# Patient Record
Sex: Male | Born: 1960 | Race: Black or African American | Hispanic: No | Marital: Married | Smoking: Never smoker
Health system: Southern US, Community
[De-identification: ages and names within clinical notes are randomized; demographics above are authoritative.]

## PROBLEM LIST (undated history)

## (undated) DIAGNOSIS — B009 Herpesviral infection, unspecified: Secondary | ICD-10-CM

## (undated) DIAGNOSIS — R51 Headache: Secondary | ICD-10-CM

## (undated) DIAGNOSIS — M47816 Spondylosis without myelopathy or radiculopathy, lumbar region: Secondary | ICD-10-CM

## (undated) DIAGNOSIS — I10 Essential (primary) hypertension: Secondary | ICD-10-CM

## (undated) HISTORY — DX: Essential (primary) hypertension: I10

## (undated) HISTORY — DX: Headache: R51

## (undated) HISTORY — DX: Herpesviral infection, unspecified: B00.9

## (undated) HISTORY — DX: Spondylosis without myelopathy or radiculopathy, lumbar region: M47.816

---

## 2007-01-25 ENCOUNTER — Emergency Department (HOSPITAL_COMMUNITY): Admission: EM | Admit: 2007-01-25 | Discharge: 2007-01-25 | Payer: Self-pay | Admitting: Emergency Medicine

## 2008-11-13 ENCOUNTER — Emergency Department (HOSPITAL_BASED_OUTPATIENT_CLINIC_OR_DEPARTMENT_OTHER): Admission: EM | Admit: 2008-11-13 | Discharge: 2008-11-13 | Payer: Self-pay | Admitting: Emergency Medicine

## 2011-05-01 ENCOUNTER — Encounter (INDEPENDENT_AMBULATORY_CARE_PROVIDER_SITE_OTHER): Payer: 59 | Admitting: Family Medicine

## 2011-05-01 DIAGNOSIS — R030 Elevated blood-pressure reading, without diagnosis of hypertension: Secondary | ICD-10-CM

## 2011-05-01 DIAGNOSIS — Z0000 Encounter for general adult medical examination without abnormal findings: Secondary | ICD-10-CM

## 2011-05-01 DIAGNOSIS — M25559 Pain in unspecified hip: Secondary | ICD-10-CM

## 2011-05-02 ENCOUNTER — Ambulatory Visit (INDEPENDENT_AMBULATORY_CARE_PROVIDER_SITE_OTHER): Payer: 59

## 2011-05-02 DIAGNOSIS — M545 Low back pain: Secondary | ICD-10-CM

## 2011-05-02 DIAGNOSIS — M25559 Pain in unspecified hip: Secondary | ICD-10-CM

## 2011-05-02 LAB — LIPID PANEL
Cholesterol: 176 mg/dL (ref 0–200)
LDL Cholesterol: 119 mg/dL
LDl/HDL Ratio: 3.7
Triglycerides: 43 mg/dL (ref 40–160)

## 2011-05-09 DIAGNOSIS — Z0271 Encounter for disability determination: Secondary | ICD-10-CM

## 2011-05-14 ENCOUNTER — Ambulatory Visit (INDEPENDENT_AMBULATORY_CARE_PROVIDER_SITE_OTHER): Payer: 59 | Admitting: Family Medicine

## 2011-05-14 ENCOUNTER — Ambulatory Visit: Payer: 59 | Admitting: Family Medicine

## 2011-05-14 DIAGNOSIS — M47817 Spondylosis without myelopathy or radiculopathy, lumbosacral region: Secondary | ICD-10-CM

## 2011-05-20 ENCOUNTER — Other Ambulatory Visit: Payer: Self-pay | Admitting: Family Medicine

## 2011-05-20 DIAGNOSIS — M545 Low back pain: Secondary | ICD-10-CM

## 2011-05-22 ENCOUNTER — Inpatient Hospital Stay: Admission: RE | Admit: 2011-05-22 | Payer: Self-pay | Source: Ambulatory Visit

## 2011-06-10 ENCOUNTER — Encounter: Payer: Self-pay | Admitting: *Deleted

## 2011-06-10 DIAGNOSIS — M47816 Spondylosis without myelopathy or radiculopathy, lumbar region: Secondary | ICD-10-CM | POA: Insufficient documentation

## 2011-06-12 ENCOUNTER — Ambulatory Visit: Payer: Self-pay | Admitting: Family Medicine

## 2011-07-07 ENCOUNTER — Ambulatory Visit: Payer: 59

## 2011-07-07 ENCOUNTER — Ambulatory Visit (INDEPENDENT_AMBULATORY_CARE_PROVIDER_SITE_OTHER): Payer: 59 | Admitting: Family Medicine

## 2011-07-07 ENCOUNTER — Encounter: Payer: Self-pay | Admitting: Physician Assistant

## 2011-07-07 VITALS — BP 152/98 | HR 96 | Temp 98.0°F | Resp 20 | Ht 70.0 in | Wt 201.0 lb

## 2011-07-07 DIAGNOSIS — K5900 Constipation, unspecified: Secondary | ICD-10-CM

## 2011-07-07 DIAGNOSIS — I10 Essential (primary) hypertension: Secondary | ICD-10-CM

## 2011-07-07 MED ORDER — HYDROCHLOROTHIAZIDE 12.5 MG PO CAPS: 12.5000 mg | ORAL_CAPSULE | ORAL | Status: DC

## 2011-07-07 NOTE — Progress Notes (Signed)
Patient ID: Calvin Baldwin MRN: [id number], DOB: May 01, 1960, 51 y.o. Date of Encounter: 07/07/2011, 4:03 PM  Primary Physician: No primary provider on file.  Chief Complaint: HTN, constipation, leg warmth  HPI: 51 y.o. year old male with history below presents for hypertension follow up. Blood pressure not at goal.  Not currently on medication. Poor diet. No CP, HA, visual changes, or focal deficits. Requests BP check today. He also mentions 3-4 month history of constipation and mild abdominal cramping. He will routinely go 3-4 days without a bowel movement. When he does does have a BM it is difficult to pass. No BRBPR or melena. Does not eat a high fiber diet. He states he gets a very bloated feeling. This embarrasses him. No GU complaints. Afebrile. Lastly, he mentions a warm sensation along his bilateral anterior shins for 3-4 months. There is no pain associated with this. He has full range of motion and full strength. No wounds or trauma. No numbness or tingling. His back pain has fully resolved. Back to work at full duty.   Past Medical History  Diagnosis Date  . HSV-1 (herpes simplex virus 1) infection   . Headache   . Lumbar spondylosis      Home Meds: Prior to Admission medications   Medication Sig Start Date End Date Taking? Authorizing Provider  valACYclovir (VALTREX) 1000 MG tablet Take 1,000 mg by mouth as needed.    Historical Provider, MD    Allergies: Allergies no known allergies  History   Social History  . Marital Status: Married    Spouse Name: N/A    Number of Children: 3  . Years of Education: N/A   Occupational History  . 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 Smoking status: Never Smoker   . Smokeless tobacco: Not on file  . Alcohol Use: Not on file  . Drug Use: Not on file  . Sexually Active: Not on file   Other Topics Concern  . Not on file   Social History Narrative   Pt native to [place]. Hiv negative  06/2004.     No family history on file.  Review of Systems: Constitutional: negative for chills, fever, night sweats, weight changes, or fatigue  HEENT: negative for vision changes, hearing loss, congestion, rhinorrhea, ST, epistaxis, or sinus pressure Cardiovascular: negative for chest pain, palpitations, or DOE Respiratory: negative for hemoptysis, wheezing, shortness of breath, or cough Abdominal: negative for abdominal pain, nausea, vomiting, diarrhea, BRBPR, or melena Dermatological: negative for rash Neurologic: negative for headache, dizziness, or syncope All other systems reviewed and are otherwise negative with the exception to those above and in the HPI.   Physical Exam: Blood pressure 152/98, pulse 96, temperature 98 F (36.7 C), temperature source Oral, resp. rate 20, height 5\' 10"  (1.778 m), weight 201 lb (91.173 kg)., Body mass index is 28.84 kg/(m^2). General: Well developed, well nourished, in no acute distress. Head: Normocephalic, atraumatic, eyes without discharge, sclera non-icteric, nares are without discharge. Bilateral auditory canals clear, TM's are without perforation, pearly grey and translucent with reflective cone of light bilaterally. Oral cavity moist, posterior pharynx without exudate, erythema, peritonsillar abscess, or post nasal drip.  Neck: Supple. No thyromegaly. Full ROM. No lymphadenopathy. No carotid bruits. Lungs: Clear bilaterally to auscultation without wheezes, rales, or rhonchi. Breathing is unlabored. Heart: RRR with S1 S2. No murmurs, rubs, or gallops appreciated.  Abdomen: Soft, non-tender, non-distended with normoactive bowel sounds. No hepatosplenomegaly. No rebound/guarding. No  obvious abdominal masses. Msk:  FROM 5/5 strength bilateral legs. Normal sensation. Normal muscle tone.  Extremities/Skin: Warm and dry. No clubbing or cyanosis. No edema. No rashes or suspicious lesions. Distal pulses 2+ and equal bilaterally. Neuro: Alert and  oriented X 3. Moves all extremities spontaneously. Gait is normal. CNII-XII grossly in tact. DTR 2+, cerebellar function intact. Rhomberg normal. Psych:  Responds to questions appropriately with a normal affect.   Labs:  CMP and TSH pending. Patient is non-fasting  [facility] reading (PRIMARY) by  Dr. Hal Hope. Constipation. O/w NAD.    ASSESSMENT AND PLAN:  51 y.o. year old male with hypertension, constipation, and warm shins. 1. Hypertension -Not at goal -Start HCTZ 12.5 mg 1 po daily #30 RF 1 -Recheck 4-5 weeks -Healthy diet and exercise  2. Constipation -Miralax -Push fluids -High fiber diet  3. Warm shins -Unclear etiology at this point in the setting of a normal exam, no rashes, wounds, swelling, or trauma -Await TSH -Trial of NSAIDs -RTC if persists  4. Discussed with Dr. Hal Hope  Signed, Eula Listen, PA-C 07/07/2011 4:03 PM

## 2011-07-08 LAB — COMPREHENSIVE METABOLIC PANEL
Alkaline Phosphatase: 98 U/L (ref 39–117)
CO2: 22 mEq/L (ref 19–32)
Creat: 0.67 mg/dL (ref 0.50–1.35)
Glucose, Bld: 137 mg/dL — ABNORMAL HIGH (ref 70–99)
Sodium: 138 mEq/L (ref 135–145)
Total Bilirubin: 0.3 mg/dL (ref 0.3–1.2)

## 2011-07-08 LAB — TSH: TSH: 0.924 u[IU]/mL (ref 0.350–4.500)

## 2011-08-18 ENCOUNTER — Ambulatory Visit (INDEPENDENT_AMBULATORY_CARE_PROVIDER_SITE_OTHER): Payer: 59 | Admitting: Physician Assistant

## 2011-08-18 ENCOUNTER — Encounter: Payer: Self-pay | Admitting: Physician Assistant

## 2011-08-18 VITALS — BP 129/77 | HR 78 | Temp 98.1°F | Resp 16 | Ht 70.0 in | Wt 200.6 lb

## 2011-08-18 DIAGNOSIS — R7309 Other abnormal glucose: Secondary | ICD-10-CM

## 2011-08-18 DIAGNOSIS — K5900 Constipation, unspecified: Secondary | ICD-10-CM

## 2011-08-18 DIAGNOSIS — R252 Cramp and spasm: Secondary | ICD-10-CM

## 2011-08-18 DIAGNOSIS — I10 Essential (primary) hypertension: Secondary | ICD-10-CM

## 2011-08-18 DIAGNOSIS — R739 Hyperglycemia, unspecified: Secondary | ICD-10-CM

## 2011-08-18 LAB — COMPREHENSIVE METABOLIC PANEL
ALT: 15 U/L (ref 0–53)
AST: 27 U/L (ref 0–37)
BUN: 11 mg/dL (ref 6–23)
Creat: 0.92 mg/dL (ref 0.50–1.35)
Total Bilirubin: 0.2 mg/dL — ABNORMAL LOW (ref 0.3–1.2)

## 2011-08-18 MED ORDER — LISINOPRIL 5 MG PO TABS: 5.0000 mg | ORAL_TABLET | Freq: Every day | ORAL | Status: DC

## 2011-08-18 NOTE — Progress Notes (Signed)
Patient ID: Calvin Baldwin MRN: [id number], DOB: [birth year], 51 y.o. Date of Encounter: 08/18/2011, 6:21 PM  Primary Physician: No primary provider on file.  Chief Complaint: HTN  HPI: 51 y.o. year old male with history below presents for hypertension follow up. Doing well. Tolerating medication daily without issues. BP is considerably improved today now that he is on medication daily. Healthy diet with minimal exercise. He does have an occasional headache as the season has been changing. No HA currently. No CP, vision changes, or focal deficits.   He does mention off and on muscle cramping along his lower back and right forearm for the past year. He has taken Motrin for this in the past with good results. No injury or trauma. No rashes.   His constipation has mostly resolved with taking Miralax daily and increasing his water intake. No further bloating or fullness along his abdomen.   Past Medical History  Diagnosis Date  . HSV-1 (herpes simplex virus 1) infection   . Headache   . Lumbar spondylosis   . Hypertension      Home Meds: Prior to Admission medications   Medication Sig Start Date End Date Taking? Authorizing Provider  ibuprofen (ADVIL,MOTRIN) 200 MG tablet Take 400 mg by mouth every 6 (six) hours as needed.   Yes Historical Provider, MD  Polyethylene Glycol 3350 (MIRALAX PO) Take by mouth.   Yes Historical Provider, MD  cyclobenzaprine (FLEXERIL) 10 MG tablet Take 10-15 mg by mouth at bedtime as needed.    Historical Provider, MD  diclofenac (VOLTAREN) 75 MG EC tablet Take 75 mg by mouth 2 (two) times daily.    Historical Provider, MD  lisinopril (PRINIVIL,ZESTRIL) 5 MG tablet Take 1 tablet (5 mg total) by mouth daily. 08/18/11 08/17/12  Raymon Mutton Jada Fass, PA-C  traMADol (ULTRAM) 50 MG tablet Take 50 mg by mouth every 12 (twelve) hours.    Historical Provider, MD  valACYclovir (VALTREX) 1000 MG tablet Take 1,000 mg by mouth as needed.    Historical Provider, MD    Allergies: No  Known Allergies  History   Social History  . Marital Status: Married    Spouse Name: N/A    Number of Children: 3  . Years of Education: N/A   Occupational History  . 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 Smoking status: Never Smoker   . Smokeless tobacco: Not on file  . Alcohol Use: No  . Drug Use: No  . Sexually Active: Not on file   Other Topics Concern  . Not on file   Social History Narrative   Pt native to [place]. Hiv negative 06/2004.     No family history on file.  Review of Systems: Constitutional: negative for chills, fever, night sweats, weight changes, or fatigue  HEENT: negative for vision changes, hearing loss, congestion, rhinorrhea, ST, epistaxis, or sinus pressure Cardiovascular: negative for chest pain, palpitations, or DOE Respiratory: negative for hemoptysis, wheezing, shortness of breath, or cough Abdominal: negative for abdominal pain, nausea, vomiting, diarrhea, or constipation Dermatological: negative for rash Neurologic: negative for headache, dizziness, or syncope All other systems reviewed and are otherwise negative with the exception to those above and in the HPI.   Physical Exam: Blood pressure 129/77, pulse 78, temperature 98.1 F (36.7 C), temperature source Oral, resp. rate 16, height 5\' 10"  (1.778 m), weight 200 lb 9.6 oz (90.992 kg)., Body mass index is 28.78 kg/(m^2). General: Well developed, well nourished, in no  acute distress. Head: Normocephalic, atraumatic, eyes without discharge, sclera non-icteric, nares are without discharge. Bilateral auditory canals clear, TM's are without perforation, pearly grey and translucent with reflective cone of light bilaterally. Oral cavity moist, posterior pharynx without exudate, erythema, peritonsillar abscess, or post nasal drip.  Neck: Supple. No thyromegaly. Full ROM. No lymphadenopathy. No carotid bruits. Lungs: Clear bilaterally to auscultation without  wheezes, rales, or rhonchi. Breathing is unlabored. Heart: RRR with S1 S2. No murmurs, rubs, or gallops appreciated.  Msk:  Strength and tone normal for age. Extremities/Skin: Warm and dry. No clubbing or cyanosis. No edema. No rashes or suspicious lesions. Distal pulses 2+ and equal bilaterally. Neuro: Alert and oriented X 3. Moves all extremities spontaneously. Gait is normal. CNII-XII grossly in tact. DTR 2+, cerebellar function intact. Rhomberg normal. Psych:  Responds to questions appropriately with a normal affect.   Labs:  CMP and A1C pending  ASSESSMENT AND PLAN:  51 y.o. year old male with HTN, muscle cramps, and headache 1. HTN -Stop HCTZ -Start Lisinopril 5 mg #30 1 po daily RF 2  2. Muscle cramps -Check electrolytes -Push water -Healthy diet -If persist with rest RTC to further discuss  3. Headache -Likely secondary to allergies at this time -If it persists plan to have him seen by the [facility]  Signed, Eula Listen, [place] 08/18/2011 6:21 PM

## 2011-11-27 ENCOUNTER — Telehealth: Payer: Self-pay

## 2011-11-27 NOTE — Telephone Encounter (Signed)
Pt received a message on his answering machine.  He has slight language barrier and could not understand message from lab.  [phone].

## 2011-11-28 NOTE — Telephone Encounter (Signed)
Spoke to patient he states he has appt next week and will review then.

## 2011-11-28 NOTE — Telephone Encounter (Signed)
Please get more information.

## 2011-12-01 ENCOUNTER — Ambulatory Visit: Payer: 59

## 2011-12-01 ENCOUNTER — Ambulatory Visit: Payer: 59 | Admitting: Physician Assistant

## 2011-12-01 ENCOUNTER — Ambulatory Visit (INDEPENDENT_AMBULATORY_CARE_PROVIDER_SITE_OTHER): Payer: 59 | Admitting: Family Medicine

## 2011-12-01 VITALS — BP 154/112 | HR 57 | Temp 98.0°F | Resp 16 | Ht 69.18 in | Wt 195.6 lb

## 2011-12-01 DIAGNOSIS — R143 Flatulence: Secondary | ICD-10-CM

## 2011-12-01 DIAGNOSIS — K5900 Constipation, unspecified: Secondary | ICD-10-CM

## 2011-12-01 DIAGNOSIS — R141 Gas pain: Secondary | ICD-10-CM

## 2011-12-01 DIAGNOSIS — R51 Headache: Secondary | ICD-10-CM

## 2011-12-01 DIAGNOSIS — R140 Abdominal distension (gaseous): Secondary | ICD-10-CM

## 2011-12-01 DIAGNOSIS — R109 Unspecified abdominal pain: Secondary | ICD-10-CM

## 2011-12-01 LAB — POCT URINALYSIS DIPSTICK
Bilirubin, UA: NEGATIVE
Blood, UA: NEGATIVE
Ketones, UA: NEGATIVE
Leukocytes, UA: NEGATIVE
Protein, UA: NEGATIVE
pH, UA: 7

## 2011-12-01 LAB — POCT CBC
HCT, POC: 49 % (ref 43.5–53.7)
Hemoglobin: 15.1 g/dL (ref 14.1–18.1)
Lymph, poc: 3.1 (ref 0.6–3.4)
MCH, POC: 29.7 pg (ref 27–31.2)
MCHC: 30.8 g/dL — AB (ref 31.8–35.4)
MPV: 8.4 fL (ref 0–99.8)
POC Granulocyte: 3 (ref 2–6.9)
POC LYMPH PERCENT: 46.5 %L (ref 10–50)
POC MID %: 8.3 %M (ref 0–12)
RDW, POC: 13.3 %
WBC: 6.7 10*3/uL (ref 4.6–10.2)

## 2011-12-01 LAB — POCT UA - MICROSCOPIC ONLY
Bacteria, U Microscopic: NEGATIVE
Casts, Ur, LPF, POC: NEGATIVE
Crystals, Ur, HPF, POC: NEGATIVE
Mucus, UA: NEGATIVE
Yeast, UA: NEGATIVE

## 2011-12-01 NOTE — Progress Notes (Signed)
Subjective: 50 year old (his birth date is listed wrong in this country) listed as 51 year old man from [place]. He works at [employer]. He has to carry 100 pound boxes. He sometimes works 12 hours a day. Is not working that well now. He hurts across his abdomen. He gets very bloated. He has been constipated, goes days without moving his bowels. He is taking MiraLax without relief. He sits on the toilet and passes a lot of gas. He feels so bloated that he cannot eat. His usual diet is a African diet of a lot of rice and bread and chicken and beef sometimes vegetables. He took one banana every day. No problems with his urine.  Objective: African male in no major distress. His chest is clear. Heart regular. Abdomen had normal bowel sounds. It is slightly full, soft without masses. Mild nonspecific tenderness. No CVA tenderness.  Assessment: Abdominal and back pain Bloating Headache Constipation  Plan: Abdominal x-rays, CBC, urine, Sinemet, TSH  Results for orders placed in visit on 12/01/11  POCT CBC      Component Value Range   WBC 6.7  4.6 - 10.2 K/uL   Lymph, poc 3.1  0.6 - 3.4   POC LYMPH PERCENT 46.5  10 - 50 %L   MID (cbc) 0.6  0 - 0.9   POC MID % 8.3  0 - 12 %M   POC Granulocyte 3.0  2 - 6.9   Granulocyte percent 45.2  37 - 80 %G   RBC 5.08  4.69 - 6.13 M/uL   Hemoglobin 15.1  14.1 - 18.1 g/dL   HCT, POC 13.0  86.5 - 53.7 %   MCV 96.4  80 - 97 fL   MCH, POC 29.7  27 - 31.2 pg   MCHC 30.8 (*) 31.8 - 35.4 g/dL   RDW, POC 78.4     Platelet Count, POC 276  142 - 424 K/uL   MPV 8.4  0 - 99.8 fL  POCT URINALYSIS DIPSTICK      Component Value Range   Color, UA yellow     Clarity, UA clear     Glucose, UA neg     Bilirubin, UA neg     Ketones, UA neg     Spec Grav, UA 1.020     Blood, UA neg     pH, UA 7.0     Protein, UA neg     Urobilinogen, UA 0.2     Nitrite, UA neg     Leukocytes, UA Negative    POCT UA - MICROSCOPIC ONLY      Component Value Range   WBC, Ur, HPF, POC  neg     RBC, urine, microscopic neg     Bacteria, U Microscopic neg     Mucus, UA neg     Epithelial cells, urine per micros neg     Crystals, Ur, HPF, POC neg     Casts, Ur, LPF, POC neg     Yeast, UA neg     [facility] reading (PRIMARY) by  Dr. Alwyn Ren Nonspecific gas stool pattern  Assessment Abdominal pain and constipation  Try duloclax.

## 2011-12-01 NOTE — Patient Instructions (Signed)
Drink more water Stop eating bananas Take Dulcolax 2 tablets daily until bowels are moving better, then as needed Take [place] daily

## 2011-12-03 LAB — COMPREHENSIVE METABOLIC PANEL
ALT: 17 U/L (ref 0–53)
AST: 31 U/L (ref 0–37)
Alkaline Phosphatase: 75 U/L (ref 39–117)
BUN: 15 mg/dL (ref 6–23)
Creat: 0.91 mg/dL (ref 0.50–1.35)
Total Bilirubin: 0.4 mg/dL (ref 0.3–1.2)

## 2011-12-04 ENCOUNTER — Ambulatory Visit: Payer: 59 | Admitting: Family Medicine

## 2011-12-08 ENCOUNTER — Encounter: Payer: Self-pay | Admitting: *Deleted

## 2013-08-06 ENCOUNTER — Other Ambulatory Visit: Payer: Self-pay | Admitting: Family Medicine

## 2013-10-29 ENCOUNTER — Ambulatory Visit (INDEPENDENT_AMBULATORY_CARE_PROVIDER_SITE_OTHER): Payer: 59 | Admitting: Internal Medicine

## 2013-10-29 ENCOUNTER — Ambulatory Visit: Payer: 59

## 2013-10-29 VITALS — BP 128/68 | HR 57 | Temp 98.6°F | Resp 16 | Ht 70.0 in | Wt 212.6 lb

## 2013-10-29 DIAGNOSIS — R1013 Epigastric pain: Secondary | ICD-10-CM

## 2013-10-29 MED ORDER — MELOXICAM 15 MG PO TABS: 15.0000 mg | ORAL_TABLET | Freq: Every day | ORAL | Status: AC

## 2013-10-29 MED ORDER — CYCLOBENZAPRINE HCL 10 MG PO TABS: 10.0000 mg | ORAL_TABLET | Freq: Every day | ORAL | Status: AC

## 2013-10-29 NOTE — Progress Notes (Signed)
Subjective:    Patient ID: Calvin Baldwin, male    DOB: 11/13/1960, 53 y.o.   MRN: [id number]  HPI  Chief Complaint  Patient presents with  . Abdominal Pain    whenever he has the back pain and neck pain he has difficulty having a bowel movement and his abdomen swells up  . Flank Pain  . unable to have a bowel movement last 3 days   C/o work-heavy lifting-many years Hurts all over when gets home Now with focus of pain stiffness in lumbar thoracic and cerv areas No sens changes or weakness But feels bad with work  Also long hx BMs q 3 d and feel bloated often  No colo  Review of Systems  Constitutional: Negative for fever, activity change, appetite change, fatigue and unexpected weight change.  HENT: Negative for trouble swallowing.   Eyes: Negative for visual disturbance.  Respiratory: Negative for cough, chest tightness, shortness of breath and wheezing.   Cardiovascular: Negative for chest pain and palpitations.  Gastrointestinal: Positive for abdominal pain, constipation and abdominal distention. Negative for nausea, vomiting, diarrhea, blood in stool, anal bleeding and rectal pain.  Genitourinary: Negative for frequency, difficulty urinating and testicular pain.  Musculoskeletal: Negative for joint swelling and neck stiffness.  Skin: Negative for rash.       Objective:   Physical Exam  Constitutional: He is oriented to person, place, and time. He appears well-developed and well-nourished. No distress.  Eyes: Conjunctivae and EOM are normal. Pupils are equal, round, and reactive to light.  Neck: No thyromegaly present.  Cardiovascular: Normal rate, normal heart sounds and intact distal pulses.   No murmur heard. Pulmonary/Chest: Effort normal and breath sounds normal.  Abdominal: Soft. Bowel sounds are normal. He exhibits no distension and no mass. There is no tenderness. There is no rebound and no guarding.  Musculoskeletal: He exhibits no edema.  Tender in parasp  muscles c-5 to L-3 But good rom slr neg No sens /moto losses  Lymphadenopathy:    He has no cervical adenopathy.  Neurological: He is alert and oriented to person, place, and time. No cranial nerve deficit.  Skin: No rash noted.   BP 128/68  Pulse 57  Temp(Src) 98.6 F (37 C) (Oral)  Resp 16  Ht  (1.778 m)  Wt 212 lb 9.6 oz (96.435 kg)  BMI 30.51 kg/m2  SpO2 98%     Wt Readings from Last 3 Encounters:  10/29/13 212 lb 9.6 oz (96.435 kg)  12/01/11 195 lb 9.6 oz (88.724 kg)  08/18/11 200 lb 9.6 oz (90.992 kg)   [facility] reading (PRIMARY) by  Dr.Newell Frater=NAD//stool!Moderate stool throughout the colon.    Assessment & Plan:  Abdominal pain, epigastric--?etil parasp muscle tenderness-?etio   Meds ordered this encounter  Medications  . meloxicam (MOBIC) 15 MG tablet    Sig: Take 1 tablet (15 mg total) by mouth daily. For muscle pain    Dispense:  30 tablet    Refill:  1  . cyclobenzaprine (FLEXERIL) 10 MG tablet    Sig: Take 1 tablet (10 mg total) by mouth at bedtime. For muscle tightness    Dispense:  30 tablet    Refill:  2   Lifestyle the issue here Disc work posture Disc diet Patient Instructions  Purchase a bottle of Miralax over the counter as well as a box of 5 mg dulcolax tablets.  Take 2 dulcolax tablets.  Wait 1 hour.  You will then drink 2 capfuls of Miralax  mixed in an adequate amount of water/juice/gatorade (you may choose which of these liquids to drink) over the next 2-3 hours.  You should do this every day til bowel movements are daily  You may then change and just take 1 capful of miralax daily for 1 month   Do back exercises daily/take meds as prescribed      F/u 2mos

## 2013-10-29 NOTE — Patient Instructions (Signed)
Purchase a bottle of Miralax over the counter as well as a box of 5 mg dulcolax tablets.  Take 2 dulcolax tablets.  Wait 1 hour.  You will then drink 2 capfuls of Miralax mixed in an adequate amount of water/juice/gatorade (you may choose which of these liquids to drink) over the next 2-3 hours.  You should do this every day til bowel movements are daily  You may then change and just take 1 capful of miralax daily for 1 month   Do back exercises daily/take meds as prescribed

## 2016-02-29 IMAGING — CR DG ABDOMEN 2V
2 series · 2 of 2 positions shown · non-contrast
Comparison: 12/01/2011

CLINICAL DATA: Abdominal pain.

EXAM:
ABDOMEN - 2 VIEW

[AP (1 of 2)]
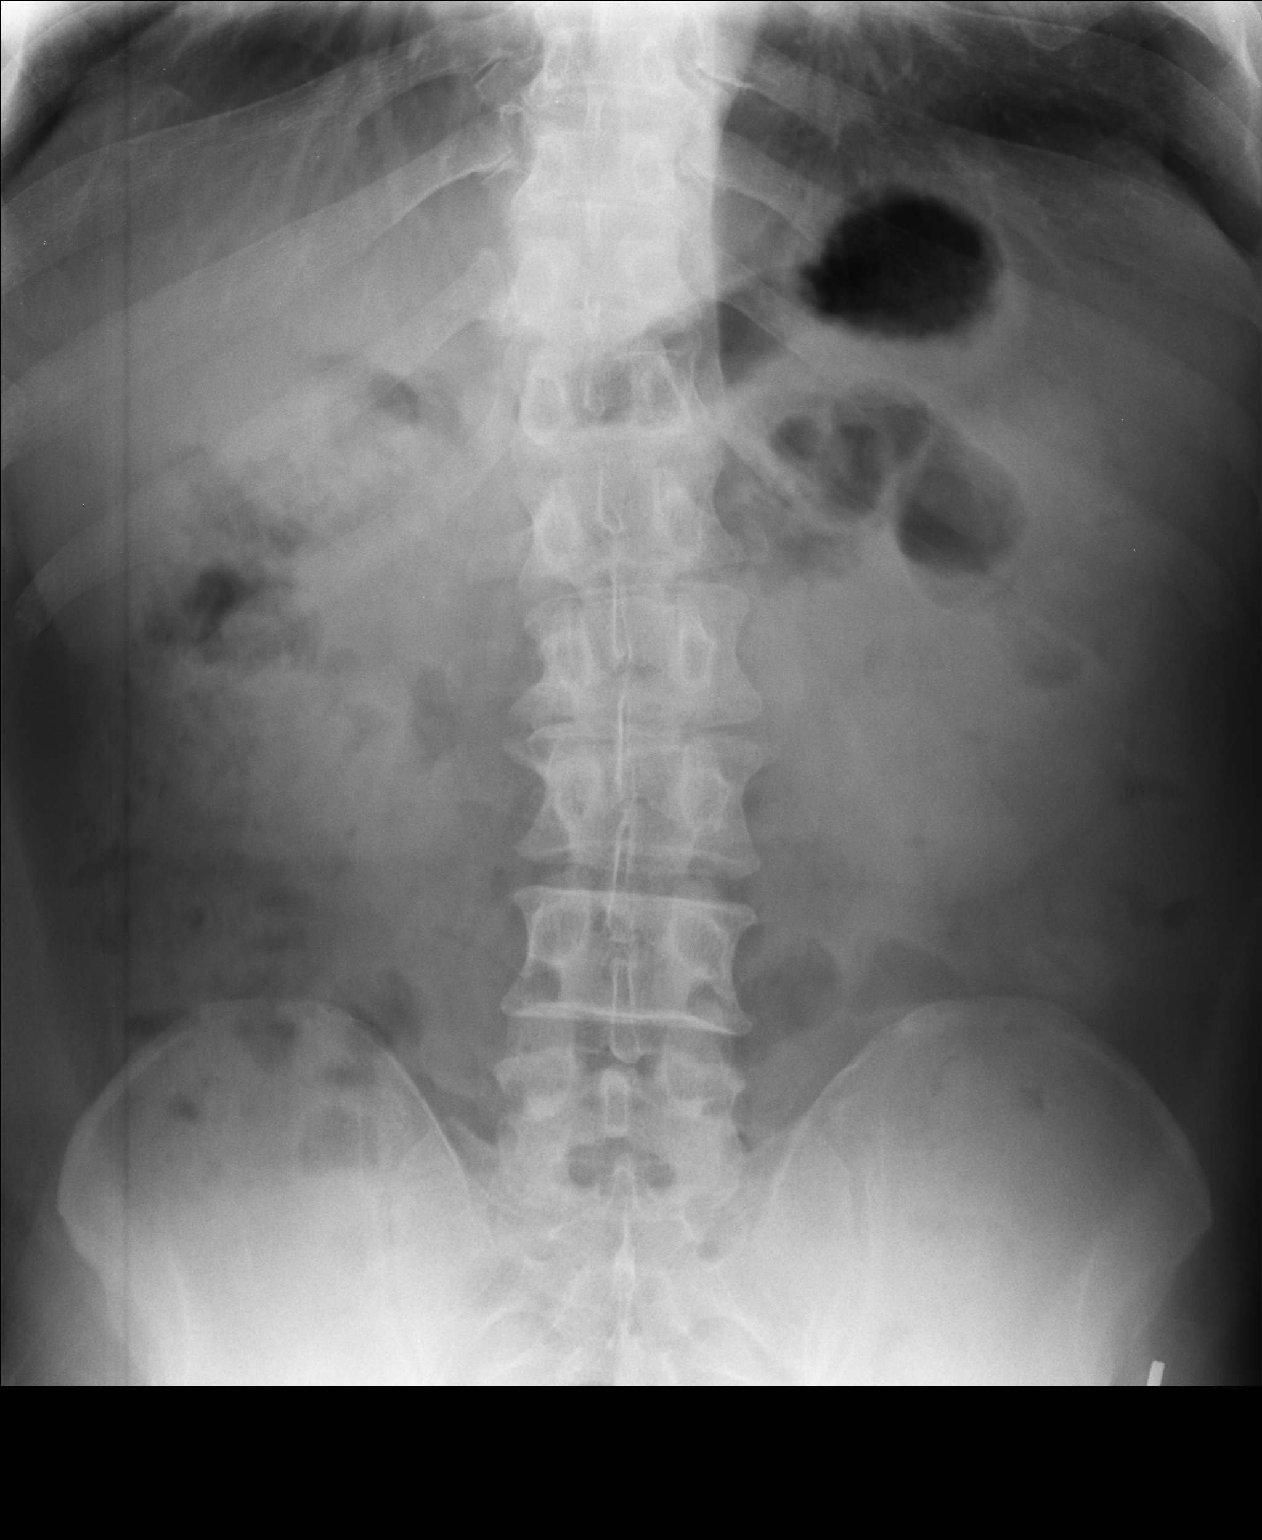

[AP (2 of 2)]
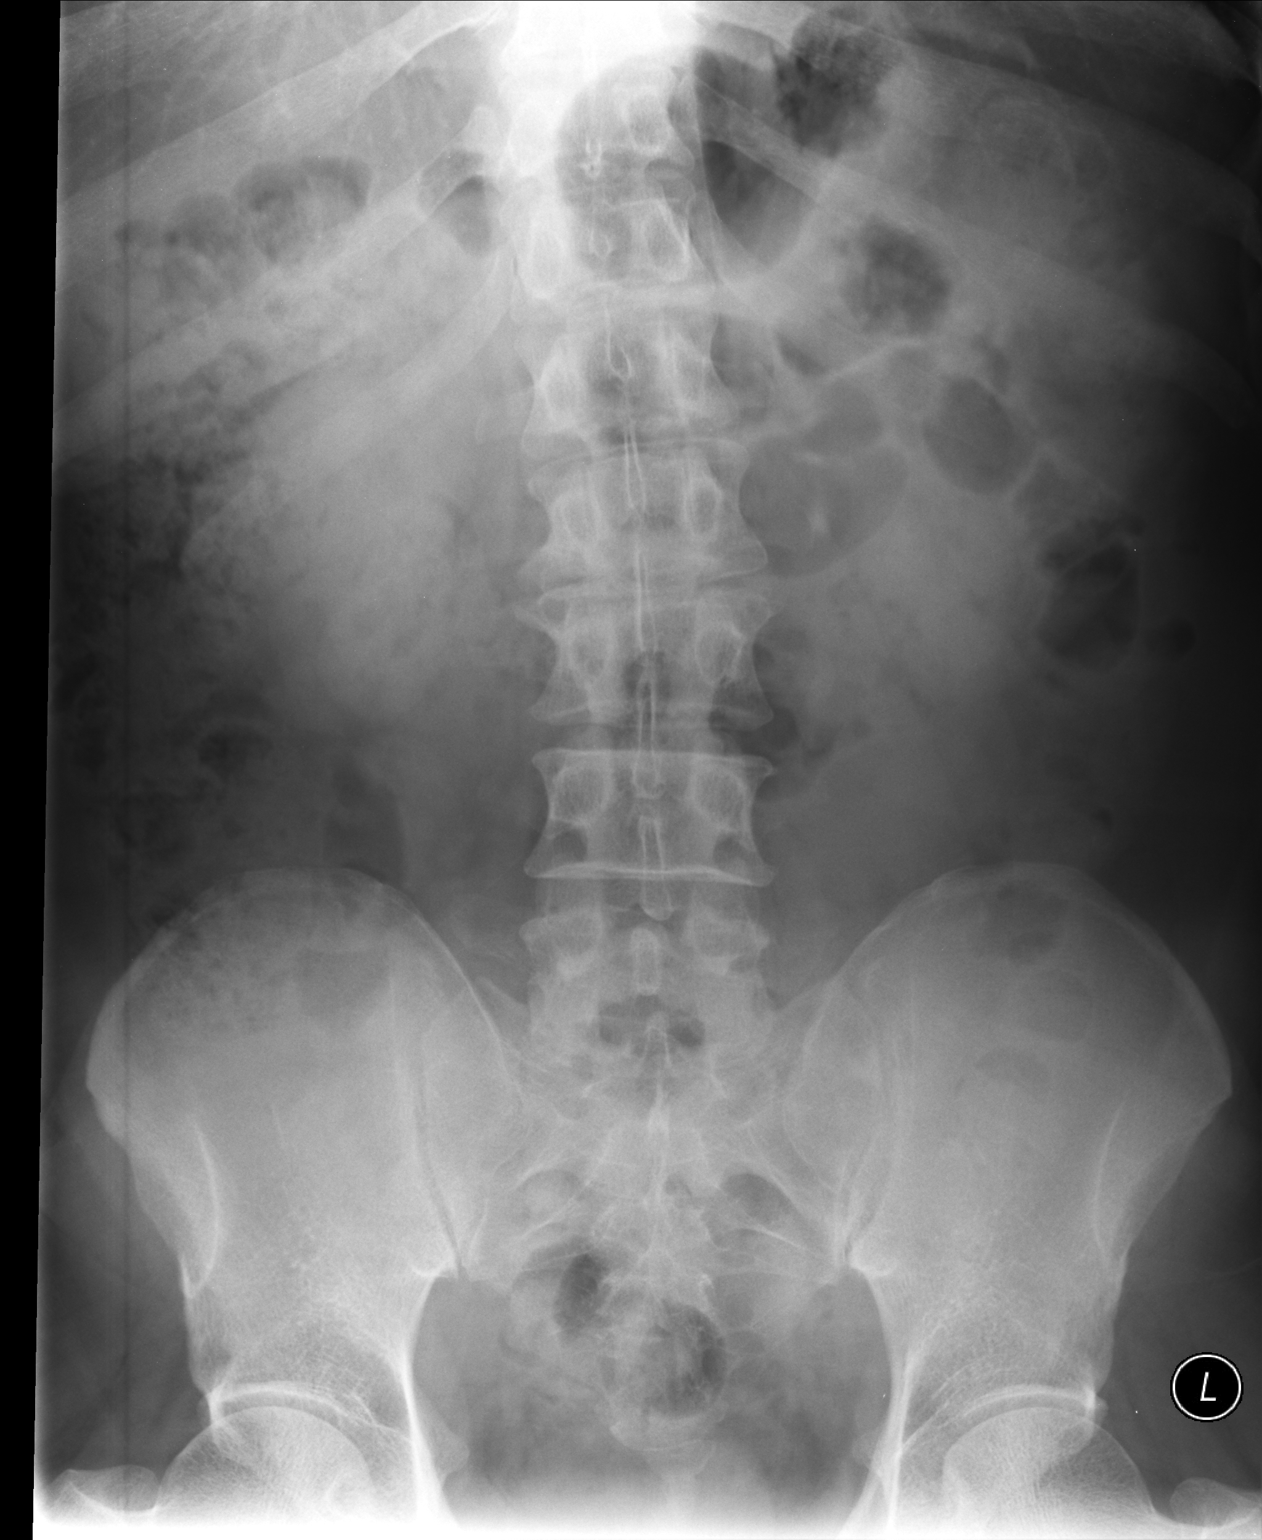

[2 of 2 positions shown; findings below may reference images not displayed]

FINDINGS: The bowel gas pattern is unremarkable. Moderate stool throughout the
colon. No findings for obstruction or perforation. The soft tissue
shadows are maintained. The bony structures are intact.
IMPRESSION: Moderate stool throughout the colon.

No findings for small bowel obstruction or free air.
